# Patient Record
Sex: Female | Born: 1945 | Race: White | Hispanic: No | Marital: Single
Health system: Southern US, Community
[De-identification: ages and names within clinical notes are randomized; demographics above are authoritative.]

---

## 2010-08-27 ENCOUNTER — Emergency Department (HOSPITAL_COMMUNITY)
Admission: EM | Admit: 2010-08-27 | Discharge: 2010-08-27 | Disposition: A | Discharge status: Home / Self Care | Payer: Medicare HMO | Attending: Emergency Medicine | Admitting: Emergency Medicine

## 2010-08-27 ENCOUNTER — Emergency Department (HOSPITAL_COMMUNITY): Payer: Medicare HMO

## 2010-08-27 DIAGNOSIS — R079 Chest pain, unspecified: Secondary | ICD-10-CM | POA: Insufficient documentation

## 2010-08-27 DIAGNOSIS — E7800 Pure hypercholesterolemia, unspecified: Secondary | ICD-10-CM | POA: Insufficient documentation

## 2010-08-27 DIAGNOSIS — R071 Chest pain on breathing: Secondary | ICD-10-CM | POA: Insufficient documentation

## 2010-08-27 DIAGNOSIS — I10 Essential (primary) hypertension: Secondary | ICD-10-CM | POA: Insufficient documentation

## 2010-08-27 DIAGNOSIS — I4460 Unspecified fascicular block: Secondary | ICD-10-CM | POA: Insufficient documentation

## 2010-08-27 DIAGNOSIS — K224 Dyskinesia of esophagus: Secondary | ICD-10-CM | POA: Insufficient documentation

## 2010-08-27 LAB — COMPREHENSIVE METABOLIC PANEL
AST: 20 U/L (ref 0–37)
Albumin: 3.9 g/dL (ref 3.5–5.2)
Alkaline Phosphatase: 59 U/L (ref 39–117)
BUN: 18 mg/dL (ref 6–23)
CO2: 30 mEq/L (ref 19–32)
Calcium: 9.5 mg/dL (ref 8.4–10.5)
Creatinine, Ser: 0.78 mg/dL (ref 0.4–1.2)
Glucose, Bld: 116 mg/dL — ABNORMAL HIGH (ref 70–99)
Sodium: 143 mEq/L (ref 135–145)
Total Bilirubin: 1.1 mg/dL (ref 0.3–1.2)
Total Protein: 7.3 g/dL (ref 6.0–8.3)

## 2010-08-27 LAB — DIFFERENTIAL
Eosinophils Relative: 3 % (ref 0–5)
Lymphocytes Relative: 39 % (ref 12–46)
Lymphs Abs: 2.5 10*3/uL (ref 0.7–4.0)
Monocytes Absolute: 0.5 10*3/uL (ref 0.1–1.0)
Monocytes Relative: 8 % (ref 3–12)
Neutro Abs: 3.2 10*3/uL (ref 1.7–7.7)

## 2010-08-27 LAB — CBC
HCT: 37.3 % (ref 36.0–46.0)
Hemoglobin: 12.5 g/dL (ref 12.0–15.0)
MCH: 30.7 pg (ref 26.0–34.0)
MCHC: 33.5 g/dL (ref 30.0–36.0)
MCV: 91.6 fL (ref 78.0–100.0)

## 2010-08-27 LAB — POCT CARDIAC MARKERS
Myoglobin, poc: 66.3 ng/mL (ref 12–200)
Myoglobin, poc: 65.4 ng/mL (ref 12–200)
Troponin i, poc: <0.05 ng/mL (ref 0.00–0.09)

## 2013-06-14 ENCOUNTER — Other Ambulatory Visit (HOSPITAL_COMMUNITY): Payer: Self-pay | Admitting: Family Medicine

## 2013-06-14 ENCOUNTER — Ambulatory Visit (HOSPITAL_COMMUNITY)
Admission: RE | Admit: 2013-06-14 | Discharge: 2013-06-14 | Disposition: A | Discharge status: Home / Self Care | Payer: Medicare Other | Source: Ambulatory Visit | Attending: Family Medicine | Admitting: Family Medicine

## 2013-06-14 DIAGNOSIS — I700 Atherosclerosis of aorta: Secondary | ICD-10-CM | POA: Insufficient documentation

## 2013-06-14 DIAGNOSIS — R05 Cough: Secondary | ICD-10-CM | POA: Insufficient documentation

## 2013-06-14 DIAGNOSIS — R059 Cough, unspecified: Secondary | ICD-10-CM | POA: Insufficient documentation

## 2013-06-14 DIAGNOSIS — R918 Other nonspecific abnormal finding of lung field: Secondary | ICD-10-CM | POA: Insufficient documentation

## 2013-06-14 DIAGNOSIS — R062 Wheezing: Secondary | ICD-10-CM | POA: Insufficient documentation

## 2014-08-09 IMAGING — CR DG CHEST 2V
2 series · 2 of 2 positions shown · non-contrast
Comparison: Prior chest x-ray 08/27/2010

CLINICAL DATA: Several week history of wheezing and coughing
despite antibiotics

EXAM:
CHEST  2 VIEW

[view not recorded (1 of 2)]
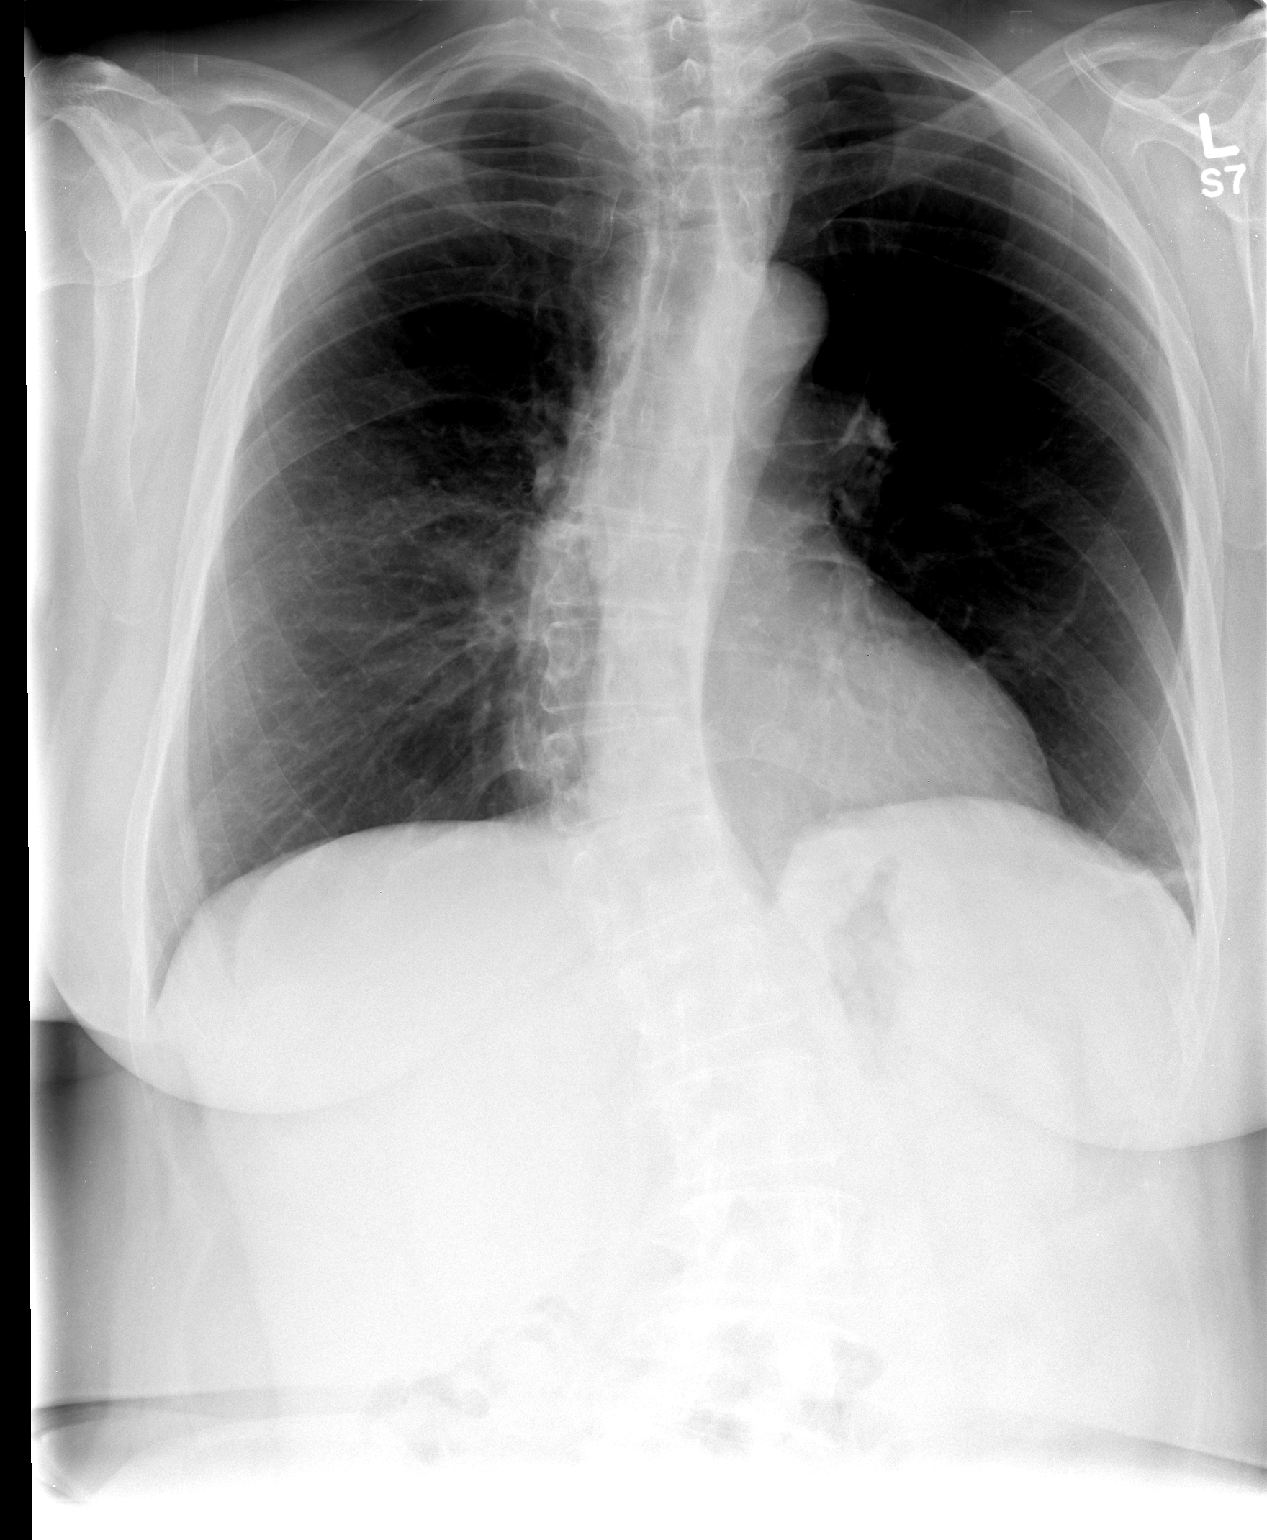

[view not recorded (2 of 2)]
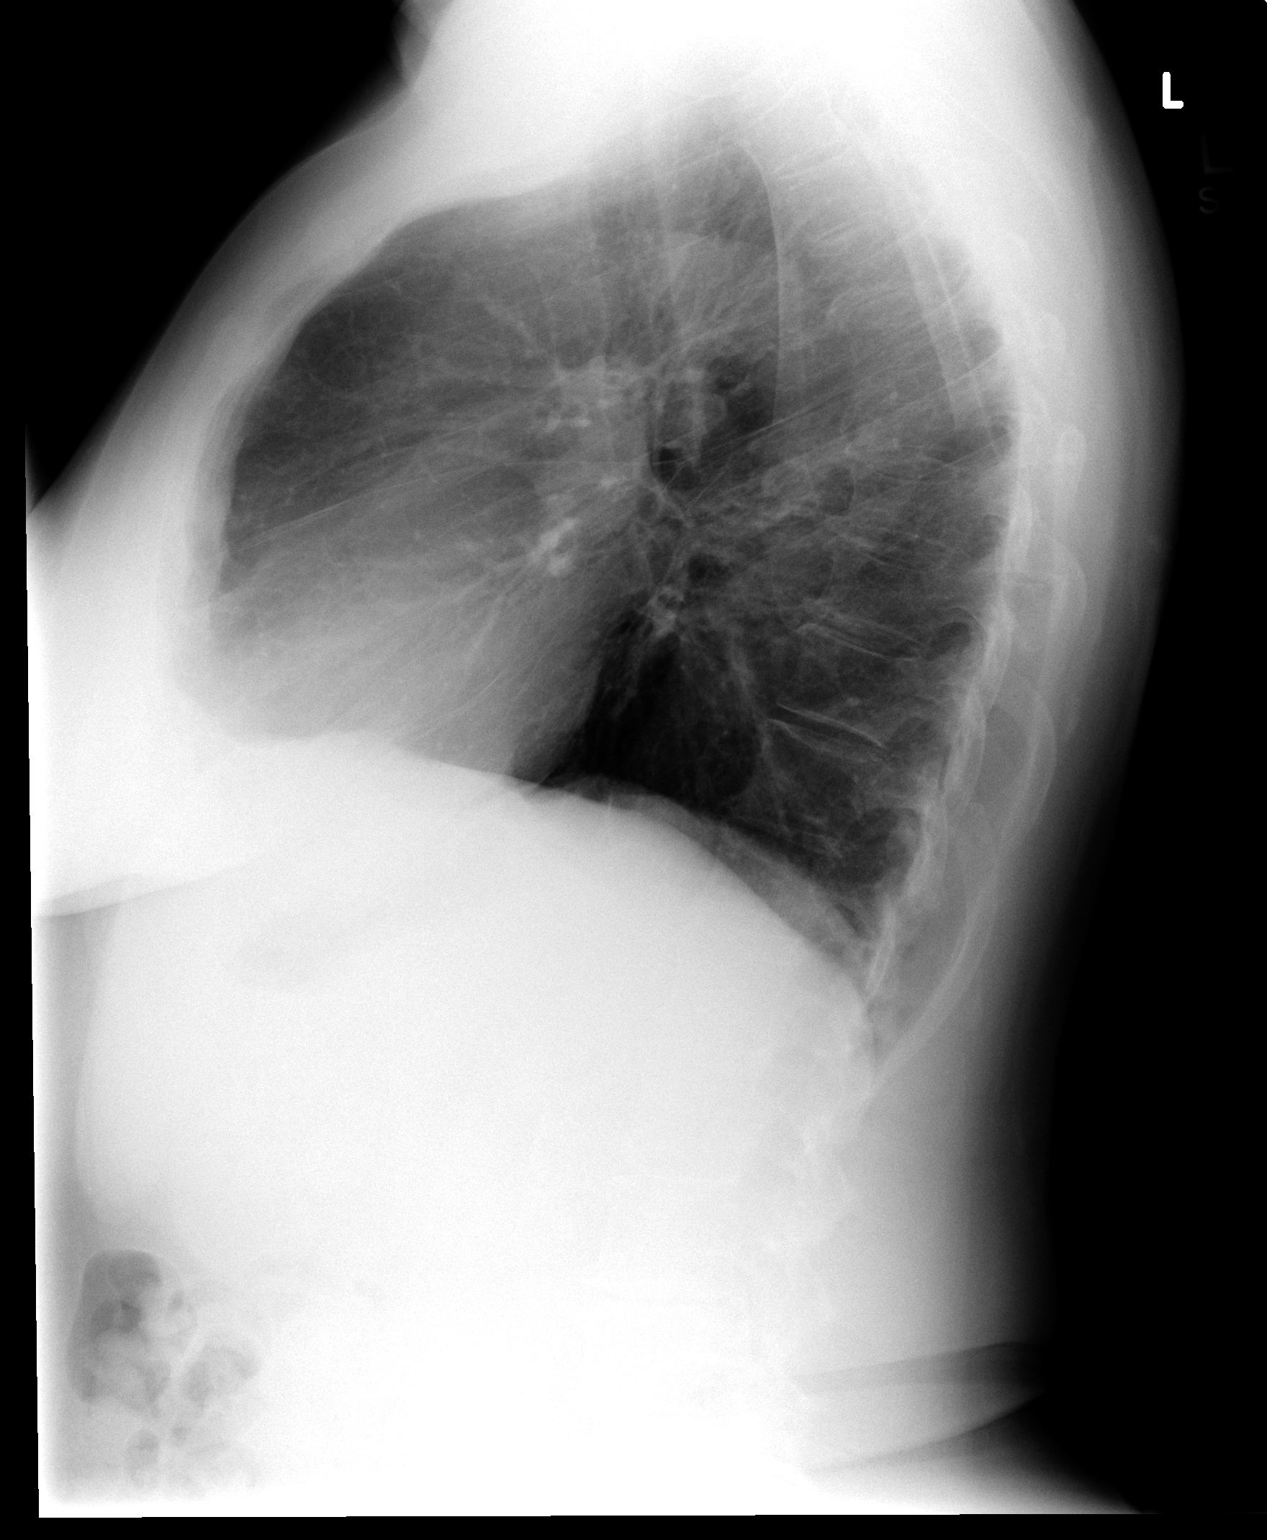

[2 of 2 positions shown; findings below may reference images not displayed]

FINDINGS: Subtle mild patchy airspace opacity in the right mid lung on the
frontal view which is asymmetric relative to the other side. The
cardiac and mediastinal contours are within normal limits.
Atherosclerotic calcifications noted in the transverse aorta. Stable
S-shaped scoliosis with the thoracic curvature dextro convex and the
lumbar curvature levoconvex. There is a rotary component the lumbar
curvature. No evidence of acute osseous abnormality. Stable linear
atelectasis versus in the lingula. No pleural effusion or
pneumothorax.
IMPRESSION: Subtle patchy airspace opacity in the right mid lung is concerning
for an underlying infectious/ inflammatory process. Recommend
imaging follow-up to resolution.

Mild aortic atherosclerosis.
